# Patient Record
Sex: Female | Born: 1975 | Hispanic: Refuse to answer | Smoking: Never smoker
Health system: Southern US, Community
[De-identification: ages and names within clinical notes are randomized; demographics above are authoritative.]

## PROBLEM LIST (undated history)

## (undated) DIAGNOSIS — E669 Obesity, unspecified: Secondary | ICD-10-CM

---

## 2001-04-16 ENCOUNTER — Emergency Department (HOSPITAL_COMMUNITY): Admission: EM | Admit: 2001-04-16 | Discharge: 2001-04-16 | Payer: Self-pay | Admitting: Emergency Medicine

## 2001-07-02 ENCOUNTER — Emergency Department (HOSPITAL_COMMUNITY): Admission: EM | Admit: 2001-07-02 | Discharge: 2001-07-02 | Payer: Self-pay | Admitting: *Deleted

## 2001-10-24 ENCOUNTER — Emergency Department (HOSPITAL_COMMUNITY): Admission: EM | Admit: 2001-10-24 | Discharge: 2001-10-24 | Payer: Self-pay | Admitting: Emergency Medicine

## 2002-03-09 ENCOUNTER — Emergency Department (HOSPITAL_COMMUNITY): Admission: EM | Admit: 2002-03-09 | Discharge: 2002-03-09 | Payer: Self-pay | Admitting: Emergency Medicine

## 2002-03-10 ENCOUNTER — Encounter: Payer: Self-pay | Admitting: General Surgery

## 2002-03-10 ENCOUNTER — Ambulatory Visit (HOSPITAL_COMMUNITY): Admission: RE | Admit: 2002-03-10 | Discharge: 2002-03-10 | Payer: Self-pay | Admitting: General Surgery

## 2002-05-17 ENCOUNTER — Emergency Department (HOSPITAL_COMMUNITY): Admission: EM | Admit: 2002-05-17 | Discharge: 2002-05-17 | Payer: Self-pay | Admitting: *Deleted

## 2002-05-17 ENCOUNTER — Encounter: Payer: Self-pay | Admitting: *Deleted

## 2002-08-18 ENCOUNTER — Emergency Department (HOSPITAL_COMMUNITY): Admission: EM | Admit: 2002-08-18 | Discharge: 2002-08-18 | Payer: Self-pay | Admitting: *Deleted

## 2003-04-23 ENCOUNTER — Encounter: Payer: Self-pay | Admitting: *Deleted

## 2003-04-23 ENCOUNTER — Emergency Department (HOSPITAL_COMMUNITY): Admission: EM | Admit: 2003-04-23 | Discharge: 2003-04-23 | Payer: Self-pay | Admitting: *Deleted

## 2003-04-25 ENCOUNTER — Encounter: Payer: Self-pay | Admitting: *Deleted

## 2003-04-25 ENCOUNTER — Ambulatory Visit (HOSPITAL_COMMUNITY): Admission: RE | Admit: 2003-04-25 | Discharge: 2003-04-25 | Payer: Self-pay | Admitting: *Deleted

## 2005-03-21 ENCOUNTER — Emergency Department (HOSPITAL_COMMUNITY): Admission: EM | Admit: 2005-03-21 | Discharge: 2005-03-21 | Payer: Self-pay | Admitting: Emergency Medicine

## 2005-08-23 ENCOUNTER — Emergency Department (HOSPITAL_COMMUNITY): Admission: EM | Admit: 2005-08-23 | Discharge: 2005-08-23 | Payer: Self-pay | Admitting: Emergency Medicine

## 2006-06-11 ENCOUNTER — Emergency Department (HOSPITAL_COMMUNITY): Admission: EM | Admit: 2006-06-11 | Discharge: 2006-06-11 | Payer: Self-pay | Admitting: Emergency Medicine

## 2006-10-12 ENCOUNTER — Emergency Department (HOSPITAL_COMMUNITY): Admission: EM | Admit: 2006-10-12 | Discharge: 2006-10-12 | Payer: Self-pay | Admitting: Emergency Medicine

## 2009-05-01 ENCOUNTER — Emergency Department (HOSPITAL_COMMUNITY): Admission: EM | Admit: 2009-05-01 | Discharge: 2009-05-01 | Payer: Self-pay | Admitting: Emergency Medicine

## 2009-09-04 ENCOUNTER — Emergency Department: Payer: Self-pay | Admitting: Emergency Medicine

## 2009-11-08 ENCOUNTER — Emergency Department: Payer: Self-pay | Admitting: Emergency Medicine

## 2010-03-25 ENCOUNTER — Emergency Department: Payer: Self-pay | Admitting: Emergency Medicine

## 2010-04-13 ENCOUNTER — Emergency Department: Payer: Self-pay | Admitting: Emergency Medicine

## 2010-05-31 ENCOUNTER — Encounter: Payer: Self-pay | Admitting: Maternal & Fetal Medicine

## 2010-06-17 ENCOUNTER — Emergency Department: Payer: Self-pay | Admitting: Internal Medicine

## 2010-07-16 ENCOUNTER — Encounter: Payer: Self-pay | Admitting: Maternal & Fetal Medicine

## 2010-08-20 ENCOUNTER — Emergency Department: Payer: Self-pay | Admitting: Emergency Medicine

## 2010-09-09 ENCOUNTER — Emergency Department: Payer: Self-pay | Admitting: Emergency Medicine

## 2010-09-15 ENCOUNTER — Emergency Department: Payer: Self-pay | Admitting: Emergency Medicine

## 2010-10-05 ENCOUNTER — Ambulatory Visit: Payer: Self-pay | Admitting: Physician Assistant

## 2010-11-27 ENCOUNTER — Observation Stay: Payer: Self-pay

## 2010-12-08 ENCOUNTER — Inpatient Hospital Stay: Payer: Self-pay | Admitting: Obstetrics and Gynecology

## 2010-12-29 LAB — URINALYSIS, ROUTINE W REFLEX MICROSCOPIC
Bilirubin Urine: NEGATIVE
Glucose, UA: NEGATIVE mg/dL
Leukocytes, UA: NEGATIVE
Nitrite: NEGATIVE
Specific Gravity, Urine: 1.02 (ref 1.005–1.030)
Urobilinogen, UA: 2 mg/dL — ABNORMAL HIGH (ref 0.0–1.0)
pH: 7 (ref 5.0–8.0)

## 2010-12-29 LAB — DIFFERENTIAL
Eosinophils Relative: 1 % (ref 0–5)
Lymphocytes Relative: 28 % (ref 12–46)
Lymphs Abs: 2 10*3/uL (ref 0.7–4.0)
Neutro Abs: 4.6 10*3/uL (ref 1.7–7.7)

## 2010-12-29 LAB — CBC
HCT: 30.7 % — ABNORMAL LOW (ref 36.0–46.0)
Hemoglobin: 10.6 g/dL — ABNORMAL LOW (ref 12.0–15.0)
Platelets: 331 10*3/uL (ref 150–400)
WBC: 7.1 10*3/uL (ref 4.0–10.5)

## 2010-12-29 LAB — URINE MICROSCOPIC-ADD ON

## 2010-12-29 LAB — BASIC METABOLIC PANEL
BUN: 7 mg/dL (ref 6–23)
GFR calc non Af Amer: >60 mL/min (ref >60)
Potassium: 3 mEq/L — ABNORMAL LOW (ref 3.5–5.1)
Sodium: 135 mEq/L (ref 135–145)

## 2010-12-29 LAB — HCG, QUANTITATIVE, PREGNANCY: hCG, Beta Chain, Quant, S: 37276 m[IU]/mL — ABNORMAL HIGH (ref <5)

## 2012-12-05 ENCOUNTER — Emergency Department (HOSPITAL_COMMUNITY): Payer: No Typology Code available for payment source

## 2012-12-05 ENCOUNTER — Emergency Department (HOSPITAL_COMMUNITY)
Admission: EM | Admit: 2012-12-05 | Discharge: 2012-12-05 | Disposition: A | Discharge status: Home / Self Care | Payer: No Typology Code available for payment source | Attending: Emergency Medicine | Admitting: Emergency Medicine

## 2012-12-05 ENCOUNTER — Encounter (HOSPITAL_COMMUNITY): Payer: Self-pay | Admitting: Emergency Medicine

## 2012-12-05 DIAGNOSIS — S59909A Unspecified injury of unspecified elbow, initial encounter: Secondary | ICD-10-CM | POA: Insufficient documentation

## 2012-12-05 DIAGNOSIS — S139XXA Sprain of joints and ligaments of unspecified parts of neck, initial encounter: Secondary | ICD-10-CM | POA: Insufficient documentation

## 2012-12-05 DIAGNOSIS — S161XXA Strain of muscle, fascia and tendon at neck level, initial encounter: Secondary | ICD-10-CM

## 2012-12-05 DIAGNOSIS — S6990XA Unspecified injury of unspecified wrist, hand and finger(s), initial encounter: Secondary | ICD-10-CM | POA: Insufficient documentation

## 2012-12-05 DIAGNOSIS — Y92410 Unspecified street and highway as the place of occurrence of the external cause: Secondary | ICD-10-CM | POA: Insufficient documentation

## 2012-12-05 DIAGNOSIS — Y9389 Activity, other specified: Secondary | ICD-10-CM | POA: Insufficient documentation

## 2012-12-05 DIAGNOSIS — IMO0002 Reserved for concepts with insufficient information to code with codable children: Secondary | ICD-10-CM | POA: Insufficient documentation

## 2012-12-05 MED ORDER — METHOCARBAMOL 750 MG PO TABS: 750.0000 mg | ORAL_TABLET | Freq: Four times a day (QID) | ORAL | Status: DC | PRN

## 2012-12-05 MED ORDER — HYDROCODONE-ACETAMINOPHEN 5-325 MG PO TABS
2.0000 | ORAL_TABLET | Freq: Once | ORAL | Status: AC
  Administered 2012-12-05: 2 via ORAL
  Filled 2012-12-05: qty 2

## 2012-12-05 MED ORDER — HYDROCODONE-ACETAMINOPHEN 5-325 MG PO TABS: 1.0000 | ORAL_TABLET | Freq: Four times a day (QID) | ORAL | Status: DC | PRN

## 2012-12-05 MED ORDER — NAPROXEN 500 MG PO TABS: 500.0000 mg | ORAL_TABLET | Freq: Two times a day (BID) | ORAL | Status: DC | PRN

## 2012-12-05 MED ORDER — DIAZEPAM 5 MG PO TABS
10.0000 mg | ORAL_TABLET | Freq: Once | ORAL | Status: AC
  Administered 2012-12-05: 10 mg via ORAL
  Filled 2012-12-05: qty 2

## 2012-12-05 NOTE — ED Provider Notes (Signed)
Medical screening examination/treatment/procedure(s) were performed by non-physician practitioner and as supervising physician I was immediately available for consultation/collaboration.    Vida Roller, MD 12/05/12 [phone]

## 2012-12-05 NOTE — Discharge Instructions (Signed)
1. Medications: robaxin, naproxyn, vicodin, usual home medications 2. Treatment: rest, drink plenty of fluids, gentle stretching as discussed, alternate ice and heat 3. Follow Up: Please followup with your primary doctor for discussion of your diagnoses and further evaluation after today's visit; if you do not have a primary care doctor use the resource guide provided to find one;   Back Exercises Back exercises help treat and prevent back injuries. The goal is to increase your strength in your belly (abdominal) and back muscles. These exercises can also help with flexibility. Start these exercises when told by your doctor. HOME CARE Back exercises include: Pelvic Tilt.  Lie on your back with your knees bent. Tilt your pelvis until the lower part of your back is against the floor. Hold this position 5 to 10 sec. Repeat this exercise 5 to 10 times. Knee to Chest.  Pull 1 knee up against your chest and hold for 20 to 30 seconds. Repeat this with the other knee. This may be done with the other leg straight or bent, whichever feels better. Then, pull both knees up against your chest. Sit-Ups or Curl-Ups.  Bend your knees 90 degrees. Start with tilting your pelvis, and do a partial, slow sit-up. Only lift your upper half 30 to 45 degrees off the floor. Take at least 2 to 3 seonds for each sit-up. Do not do sit-ups with your knees out straight. If partial sit-ups are difficult, simply do the above but with only tightening your belly (abdominal) muscles and holding it as told. Hip-Lift.  Lie on your back with your knees flexed 90 degrees. Push down with your feet and shoulders as you raise your hips 2 inches off the floor. Hold for 10 seconds, repeat 5 to 10 times. Back Arches.  Lie on your stomach. Prop yourself up on bent elbows. Slowly press on your hands, causing an arch in your low back. Repeat 3 to 5 times. Shoulder-Lifts.  Lie face down with arms beside your body. Keep hips and belly  pressed to floor as you slowly lift your head and shoulders off the floor. Do not overdo your exercises. Be careful in the beginning. Exercises may cause you some mild back discomfort. If the pain lasts for more than 15 minutes, stop the exercises until you see your doctor. Improvement with exercise for back problems is slow.  Document Released: 10/12/2010 Document Revised: 12/02/2011 Document Reviewed: 07/11/2011 [facility] Patient Information 2013 [place], [place].    Cervical Strain Care After A cervical strain is when the muscles and ligaments in your neck have been stretched. The bones are not broken. If you had any problems moving your arms or legs immediately after the injury, even if the problem has gone away, make sure to tell this to your caregiver.  HOME CARE INSTRUCTIONS   While awake, apply ice packs to the neck or areas of pain about every 1 to 2 hours, for 15 to 20 minutes at a time. Do this for 2 days. If you were given a cervical collar for support, ask your caregiver if you may remove it for bathing or applying ice.   If given a cervical collar, wear as instructed. Do not remove any collar unless instructed by a caregiver.   Only take over-the-counter or prescription medicines for pain, discomfort, or fever as directed by your caregiver.  Recheck with the hospital or clinic after a radiologist has read your X-rays. Recheck with the hospital or clinic to make sure the initial readings are  correct. Do this also to determine if you need further studies. It is your responsibility to find out your X-ray results. X-rays are sometimes repeated in one week to ten days. These are often repeated to make sure that a hairline fracture was not overlooked. Ask your caregiver how you are to find out about your radiology (X-ray) results. SEEK IMMEDIATE MEDICAL CARE IF:   You have increasing pain in your neck.   You develop difficulties swallowing or breathing.   You have numbness, weakness, or  movement problems in the arms or legs.   You have difficulty walking.   You develop bowel or bladder retention or incontinence.   You have problems with walking.  MAKE SURE YOU:   Understand these instructions.   Will watch your condition.   Will get help right away if you are not doing well or get worse.  Document Released: 09/09/2005 Document Revised: 05/22/2011 Document Reviewed: 04/22/2008 [facility] Patient Information 2012 [place], [place].        RESOURCE GUIDE  Chronic Pain Problems: Contact [facility]  [phone] Patients need to be referred by their primary care doctor.  Insufficient Money for Medicine: Contact United Way:  call "211."   No Primary Care Doctor: - Call Health Connect  [phone] - can help you locate a primary care doctor that  accepts your insurance, provides certain services, etc. - Physician Referral Service- [phone]  Agencies that provide inexpensive medical care: - [facility]  [phone] - [facility]  [phone] - [facility]  [phone] - Women's Clinic  [phone] - Planned Parenthood  [phone] [facility]  [phone]  Medicaid-accepting [facility] Providers: - [facility]- [address]  [phone], Mon-Fri 9am-7pm, Sat 9am-1pm - [facility]- [address] [place], [address] [place]  [phone] - [facility]- [address], [address] [place]  [phone] [facility]- [address]  [phone] - Renaye Rakers- [address] [place], [address], [phone]  Only accepts [place] patients after they have their name  applied to their card  Self Pay (no insurance) in [place]: - Sickle Cell Patients: Dr Willey Blade, [facility] Internal Medicine  [address] [place] [address] [place], [phone] - [facility]- [address] [place]  [phone]       [facility]  [place]- [address]       -     [facility]- see information above (Speak to [employer] if you do not have insurance)       -  [facility] [place]- [address],  [phone]       -  [facility]- [address] [phone]       -  Dr Julio Sicks-  [address], [place], [phone]       -  [facility] - [address], [phone]       -  [facility]- [address] [phone], also [address], [phone]       -    [facility]- [address] [place], [phone], 1st & 3rd Saturday        every month, 10am-1pm  [facility] [address] [place], [place] [address] [phone]  [facility] [address] [place], [place] [address] [phone]  1) Find a Doctor and Pay Out of Pocket Although you won't have to find out who is covered by your insurance plan, it is a good idea to ask around and get recommendations. You will then need to call the office and see if the doctor you have chosen will accept you as a new patient and what types of options they offer for patients who are self-pay. Some doctors offer discounts or will set up payment plans for their patients who do not have insurance, but you will need to ask so you aren't surprised when you get to your appointment.  2) Contact Your Local Health Department Not all health departments have doctors that can see patients for sick visits, but many do, so it is worth a call to see if yours does. If you don't know where your local health department is, you can check in your phone book. The CDC also has a tool to help you locate your state's health department, and many state websites also have listings of all of their local health departments.  3) Find a Walk-in Clinic If your illness is not likely to be very severe or complicated, you may want to try a walk in clinic. These are popping up all  over the country in pharmacies, drugstores, and shopping centers. They're usually staffed by nurse practitioners or physician assistants that have been trained to treat common illnesses and complaints. They're usually fairly quick and inexpensive. However, if you have serious medical issues or chronic medical problems, these are probably not your best option  STD Testing - [facility] Department of [facility] [place], STD Clinic, [address] [place], phone [phone] or [phone].  Monday - Friday, call for an appointment. [facility] Department of [employer], STD Clinic, [address], [place], phone [phone] or [phone].  Monday - Friday, call for an appointment.  Abuse/Neglect: [facility] Child Abuse Hotline [phone] [facility] Child Abuse Hotline 838-246-9564 (After Hours)  Emergency Shelter:  [facility] [phone]  Maternity Homes: - [facility] at [facility] [phone] - [facility] [phone]  MRSA Hotline #:   (616)624-8645  Dental Assistance  If unable to pay or uninsured, contact:  [facility]. to become qualified for the adult dental clinic.  Patients with Medicaid: [facility] [phone] [address], [phone] [address] [phone]  If unable to pay, or uninsured, contact [facility] [phone] in [place], [phone] in [facility]) to become qualified for the adult dental clinic  [facility] [address] [place], [place] [address] [phone] [email or web address]  Other Proofreader Services: - [facility]- [address] [place], [place], [place], [address], [phone], Ext. [phone], 2nd and 4th Thursday of the month at 6:30am.  10 clients each day by appointment, can sometimes see walk-in patients if someone does not show for an appointment. [facility]- [address] [place], [place], [address], [phone] - [facility] [address] [place], [place], [address], [phone] - [facility]- [phone] [facility] Health Department- [phone] [facility] Health [phone]       Behavioral Health Resources in the [facility]  Intensive Outpatient Programs: [facility]      [address] [place],   [phone] Both a day and evening program       [facility] Outpatient     [address]        [place], [place] [address] [phone]         ADS: [facility] [address] [place] [phone]  [facility] Mental Health ACCESS LINE: [phone] or [phone] [address] [place], [place] [address] [email or web address]  Behavioral Health Services  Substance Abuse Resources: - [facility]  [phone] - [facility] [phone] - [facility] [phone] Floydene Flock [phone] - Residential & Outpatient Substance Abuse Program  [phone]  Psychological Services: [facility]  [phone] [facility] Services  [phone] - [facility], [phone] [place]. [address] [place], ACCESS LINE: [phone] or [phone], [email or web address]  Mobile Crisis Teams:                                        Therapeutic Alternatives         Mobile Crisis Care Unit [phone]             [facility] [address] Ginette Otto [phone]                                         Interventionist [address] [place] [phone]  Self-Help/Support Groups: Mental Health Assoc. of [employer] of support groups 6801704398 (call for more info)   Narcotics Anonymous (NA) Caring Services [address] [place] - 2 meetings at this location  Residential Treatment Programs:  [facility]      [address]        [place]       [phone]         [facility] [address], [place] [address] [place], [place]  [address] [phone]  [facility]  [address] [place], [place] [address] [phone] Admissions: 8am-3pm M-F  [facility]     [address]        [place], [place] [address]       [phone]         [facility] [address] [place], [place] [phone]  [facility] [address] [place], [place] [phone]  [facility] - Intensive Outpatient      [address]     [place], [place]       [phone]         [facility] ([facility]     [address] [place], [place] [phone] or [phone]  Residential Treatment Services (RTS), Medicaid [address] [place], [place] [phone]  [facility] [address]                                               [address] [place] [phone]  [facility] Resources: Estate manager/land agent [phone]               General Therapy  Angie Fava, PhD        [address] [place], [place] [address]         [phone]   Insurance  [facility] Behavioral   [address] [place], [place] [address] [phone]  [facility] [address] [place], [place] [address] [phone] Insurance/Medicaid/sponsorship through [facility] and Families                                              [address]                                        [place], [place] [address]    Therapy/tele-psych/case         [phone]          [facility] [address], [place]  [address]  Adolescent/group home/case  management [phone]                                           Creola Corn PhD       General therapy       Insurance   [phone]         Dr. Lolly Mustache, Insurance, M-F [phone]  [facility] [place]  United [employer] [facility] Dept. [address]                 [address]         [address] [place] [address]  Blondell Reveal Phone:  [phone]                                  Phone:  [phone]                   Phone:  [phone]  [facility] Mental Health, [phone] - [facility]- [phone]       -     [facility] in [place], [address],             [phone], Insurance  [place] Child Abuse Hotline [phone]) [phone] or [phone] (After Hours)
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
## 2013-02-28 ENCOUNTER — Emergency Department (HOSPITAL_COMMUNITY)
Admission: EM | Admit: 2013-02-28 | Discharge: 2013-02-28 | Disposition: A | Discharge status: Home / Self Care | Payer: Self-pay | Attending: Emergency Medicine | Admitting: Emergency Medicine

## 2013-02-28 ENCOUNTER — Encounter (HOSPITAL_COMMUNITY): Payer: Self-pay

## 2013-02-28 DIAGNOSIS — R0789 Other chest pain: Secondary | ICD-10-CM

## 2013-02-28 DIAGNOSIS — M79609 Pain in unspecified limb: Secondary | ICD-10-CM | POA: Insufficient documentation

## 2013-02-28 DIAGNOSIS — R52 Pain, unspecified: Secondary | ICD-10-CM | POA: Insufficient documentation

## 2013-02-28 DIAGNOSIS — Z3202 Encounter for pregnancy test, result negative: Secondary | ICD-10-CM | POA: Insufficient documentation

## 2013-02-28 DIAGNOSIS — R071 Chest pain on breathing: Secondary | ICD-10-CM | POA: Insufficient documentation

## 2013-02-28 LAB — URINALYSIS, ROUTINE W REFLEX MICROSCOPIC
Bilirubin Urine: NEGATIVE
Leukocytes, UA: NEGATIVE
Nitrite: NEGATIVE
Specific Gravity, Urine: 1.023 (ref 1.005–1.030)
pH: 6 (ref 5.0–8.0)

## 2013-02-28 LAB — URINE MICROSCOPIC-ADD ON

## 2013-02-28 LAB — POCT PREGNANCY, URINE: Preg Test, Ur: NEGATIVE

## 2013-02-28 MED ORDER — DIAZEPAM 5 MG PO TABS
5.0000 mg | ORAL_TABLET | Freq: Once | ORAL | Status: AC
  Administered 2013-02-28: 5 mg via ORAL
  Filled 2013-02-28: qty 1

## 2013-02-28 MED ORDER — IBUPROFEN 800 MG PO TABS: 800.0000 mg | ORAL_TABLET | Freq: Three times a day (TID) | ORAL | Status: AC

## 2013-02-28 MED ORDER — HYDROCODONE-ACETAMINOPHEN 5-325 MG PO TABS: 1.0000 | ORAL_TABLET | Freq: Three times a day (TID) | ORAL | Status: DC | PRN

## 2013-02-28 MED ORDER — IBUPROFEN 800 MG PO TABS
800.0000 mg | ORAL_TABLET | Freq: Once | ORAL | Status: AC
  Administered 2013-02-28: 800 mg via ORAL
  Filled 2013-02-28: qty 1

## 2013-02-28 MED ORDER — DIAZEPAM 5 MG PO TABS: 5.0000 mg | ORAL_TABLET | Freq: Two times a day (BID) | ORAL | Status: AC

## 2013-02-28 MED ORDER — HYDROCODONE-ACETAMINOPHEN 5-325 MG PO TABS
1.0000 | ORAL_TABLET | Freq: Once | ORAL | Status: AC
  Administered 2013-02-28: 1 via ORAL
  Filled 2013-02-28: qty 1

## 2013-02-28 NOTE — ED Notes (Addendum)
Patient asked to give urine specimen but  Can't void at present moment. Nurse aware

## 2013-02-28 NOTE — ED Notes (Signed)
MD at bedside.  EDP [place] in to see this pt and evaluate,

## 2013-02-28 NOTE — ED Provider Notes (Signed)
History     CSN: [id number]  Arrival date & time 02/28/13  1657   First MD Initiated Contact with Patient 02/28/13 1734      Chief Complaint  Patient presents with  . Flank Pain    (Consider location/radiation/quality/duration/timing/severity/associated sxs/prior treatment) HPI Patient presents after the acute onset of pain in her left inferior axilla.  Pain began just prior to arrival when the patient was reaching for a heavy object at work.  Since onset the pain has been severe, sharp, and the left inferior axilla with radiation towards the inferior breast.  No other abdominal pain, no chest pain, no dyspnea no lightheadedness, no CP, no nausea, no vomiting. No tabs at relief with anything. She states that she was in her usual state of health until this event. History reviewed. No pertinent past medical history.  History reviewed. No pertinent past surgical history.  History reviewed. No pertinent family history.  History  Substance Use Topics  . Smoking status: Never Smoker   . Smokeless tobacco: Not on file  . Alcohol Use: No    OB History   Grav Para Term Preterm Abortions TAB SAB Ect Mult Living                  Review of Systems  All other systems reviewed and are negative.    Allergies  Review of patient's allergies indicates no known allergies.  Home Medications   Current Outpatient Rx  Name  Route  Sig  Dispense  Refill  . diazepam (VALIUM) 5 MG tablet   Oral   Take 1 tablet (5 mg total) by mouth 2 (two) times daily.   6 tablet   0   . HYDROcodone-acetaminophen (NORCO/VICODIN) 5-325 MG per tablet   Oral   Take 1 tablet by mouth every 8 (eight) hours as needed for pain.   15 tablet   0   . ibuprofen (ADVIL,MOTRIN) 800 MG tablet   Oral   Take 1 tablet (800 mg total) by mouth 3 (three) times daily.   12 tablet   0     BP 121/73  Pulse 71  Temp(Src) 98.3 F (36.8 C) (Oral)  Resp 15  SpO2 100%  LMP 02/25/2013  Physical Exam  Nursing  note and vitals reviewed. Constitutional: She is oriented to person, place, and time. She appears well-developed and well-nourished. No distress.  HENT:  Head: Normocephalic and atraumatic.  Eyes: Conjunctivae and EOM are normal.  Cardiovascular: Normal rate and regular rhythm.   Pulmonary/Chest: Effort normal. No accessory muscle usage or stridor. No apnea and not tachypneic. No respiratory distress. She has no decreased breath sounds. She has no wheezes. She has no rhonchi. She has no rales.  No appreciable deformity. The patient states that the pain feels deeper than the area palpated about the left inferior chest.  1  Abdominal: She exhibits no distension.  Musculoskeletal: She exhibits no edema.  Neurological: She is alert and oriented to person, place, and time. No cranial nerve deficit.  Skin: Skin is warm and dry.  Psychiatric: She has a normal mood and affect.    ED Course  Procedures (including critical care time)  Labs Reviewed  URINALYSIS, ROUTINE W REFLEX MICROSCOPIC - Abnormal; Notable for the following:    Hgb urine dipstick TRACE (*)    All other components within normal limits  URINE MICROSCOPIC-ADD ON  POCT PREGNANCY, URINE   No results found.   1. Acute chest wall pain    On  repeat exam, after discussion of results, the patient references that she just finished her menstrual cycle. She has no history of kidney stones. She has marginal improvement in her pain.    MDM  The patient presents with acute onset of left axillary pain while reaching for a heavy object.  The absence of hypoxia, tachypnea his reassuring for the low suspicion of pneumothorax or spontaneous fracture of a rib. The absence of any urinary complaints, hematuria grossly, history of nephrolithiasis is reassuring for the low suspicion of nephrolithiasis. The patient is hemodynamically stable. After return of results, and reassessment I discussed discharged home with a trial of analgesics  versus a CT scan or other imaging for additional evaluation now.  The patient voiced her preference for an attempted and analgesics at home, and stated an understanding of return precautions, follow up instructions. The patient was with her husband, who concurs.        Gerhard Munch, MD 02/28/13 2000

## 2013-02-28 NOTE — ED Notes (Signed)
She c/o sudden onset of left flank pain while pushing a cart at work, which persists.  She grimaces as if in much pain.  She ambulates slowly and capably.

## 2014-05-26 ENCOUNTER — Emergency Department (HOSPITAL_COMMUNITY)
Admission: EM | Admit: 2014-05-26 | Discharge: 2014-05-26 | Disposition: A | Discharge status: Home / Self Care | Payer: No Typology Code available for payment source | Attending: Emergency Medicine | Admitting: Emergency Medicine

## 2014-05-26 ENCOUNTER — Emergency Department (HOSPITAL_COMMUNITY): Payer: No Typology Code available for payment source

## 2014-05-26 ENCOUNTER — Encounter (HOSPITAL_COMMUNITY): Payer: Self-pay | Admitting: Emergency Medicine

## 2014-05-26 DIAGNOSIS — S298XXA Other specified injuries of thorax, initial encounter: Secondary | ICD-10-CM | POA: Diagnosis not present

## 2014-05-26 DIAGNOSIS — R1084 Generalized abdominal pain: Secondary | ICD-10-CM

## 2014-05-26 DIAGNOSIS — Z3202 Encounter for pregnancy test, result negative: Secondary | ICD-10-CM | POA: Diagnosis not present

## 2014-05-26 DIAGNOSIS — R0789 Other chest pain: Secondary | ICD-10-CM

## 2014-05-26 DIAGNOSIS — S3981XA Other specified injuries of abdomen, initial encounter: Secondary | ICD-10-CM | POA: Diagnosis not present

## 2014-05-26 DIAGNOSIS — Y92410 Unspecified street and highway as the place of occurrence of the external cause: Secondary | ICD-10-CM | POA: Diagnosis not present

## 2014-05-26 DIAGNOSIS — Y9389 Activity, other specified: Secondary | ICD-10-CM | POA: Diagnosis not present

## 2014-05-26 LAB — POC URINE PREG, ED: PREG TEST UR: NEGATIVE

## 2014-05-26 LAB — COMPREHENSIVE METABOLIC PANEL
ALT: 10 U/L (ref 0–35)
AST: 14 U/L (ref 0–37)
Albumin: 3.3 g/dL — ABNORMAL LOW (ref 3.5–5.2)
Alkaline Phosphatase: 86 U/L (ref 39–117)
Anion gap: 13 (ref 5–15)
BUN: 15 mg/dL (ref 6–23)
CALCIUM: 9 mg/dL (ref 8.4–10.5)
CO2: 22 meq/L (ref 19–32)
CREATININE: 0.71 mg/dL (ref 0.50–1.10)
Chloride: 102 mEq/L (ref 96–112)
GLUCOSE: 90 mg/dL (ref 70–99)
Potassium: 3.7 mEq/L (ref 3.7–5.3)
SODIUM: 137 meq/L (ref 137–147)
Total Bilirubin: <0.2 mg/dL — ABNORMAL LOW (ref 0.3–1.2)
Total Protein: 7.7 g/dL (ref 6.0–8.3)

## 2014-05-26 LAB — CBC WITH DIFFERENTIAL/PLATELET
Basophils Absolute: 0 10*3/uL (ref 0.0–0.1)
Basophils Relative: 0 % (ref 0–1)
EOS PCT: 1 % (ref 0–5)
Eosinophils Absolute: 0.1 10*3/uL (ref 0.0–0.7)
HEMATOCRIT: 29.5 % — AB (ref 36.0–46.0)
Hemoglobin: 9.1 g/dL — ABNORMAL LOW (ref 12.0–15.0)
LYMPHS ABS: 2.2 10*3/uL (ref 0.7–4.0)
LYMPHS PCT: 19 % (ref 12–46)
MCH: 23.2 pg — ABNORMAL LOW (ref 26.0–34.0)
MCHC: 30.8 g/dL (ref 30.0–36.0)
MCV: 75.3 fL — AB (ref 78.0–100.0)
MONO ABS: 0.9 10*3/uL (ref 0.1–1.0)
MONOS PCT: 8 % (ref 3–12)
Neutro Abs: 8.3 10*3/uL — ABNORMAL HIGH (ref 1.7–7.7)
Neutrophils Relative %: 72 % (ref 43–77)
Platelets: 444 10*3/uL — ABNORMAL HIGH (ref 150–400)
RBC: 3.92 MIL/uL (ref 3.87–5.11)
RDW: 16.9 % — ABNORMAL HIGH (ref 11.5–15.5)
WBC: 11.5 10*3/uL — AB (ref 4.0–10.5)

## 2014-05-26 LAB — URINALYSIS, ROUTINE W REFLEX MICROSCOPIC
Bilirubin Urine: NEGATIVE
Glucose, UA: NEGATIVE mg/dL
Ketones, ur: NEGATIVE mg/dL
Leukocytes, UA: NEGATIVE
Nitrite: NEGATIVE
PROTEIN: 30 mg/dL — AB
Specific Gravity, Urine: 1.023 (ref 1.005–1.030)
UROBILINOGEN UA: 1 mg/dL (ref 0.0–1.0)
pH: 6 (ref 5.0–8.0)

## 2014-05-26 LAB — URINE MICROSCOPIC-ADD ON

## 2014-05-26 MED ORDER — HYDROCODONE-ACETAMINOPHEN 5-325 MG PO TABS
2.0000 | ORAL_TABLET | Freq: Once | ORAL | Status: AC
  Administered 2014-05-26: 2 via ORAL
  Filled 2014-05-26: qty 2

## 2014-05-26 MED ORDER — HYDROCODONE-ACETAMINOPHEN 5-325 MG PO TABS: 1.0000 | ORAL_TABLET | ORAL | Status: DC | PRN

## 2014-05-26 MED ORDER — FENTANYL CITRATE 0.05 MG/ML IJ SOLN
50.0000 ug | INTRAMUSCULAR | Status: DC | PRN
  Filled 2014-05-26: qty 2

## 2014-05-26 NOTE — ED Notes (Signed)
Pt refused IV  

## 2014-05-26 NOTE — ED Notes (Signed)
Dr.Zavitz aware of pt refusing IV

## 2014-05-26 NOTE — Discharge Instructions (Signed)
Return if you change your  Mind and would like a CT scan or if your symptoms worsen.  If you were given medicines take as directed.  If you are on coumadin or contraceptives realize their levels and effectiveness is altered by many different medicines.  If you have any reaction (rash, tongues swelling, other) to the medicines stop taking and see a physician.   Please follow up as directed and return to the ER or see a physician for new or worsening symptoms.  Thank you. Filed Vitals:   05/26/14 1807 05/26/14 2039  BP: 138/86 118/60  Pulse: 100 75  Temp: 99 F (37.2 C)   TempSrc: Oral   Resp: 16 16  Weight: 240 lb (108.863 kg)   SpO2: 98% 99%
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
## 2014-05-29 ENCOUNTER — Emergency Department (HOSPITAL_COMMUNITY)
Admission: EM | Admit: 2014-05-29 | Discharge: 2014-05-30 | Disposition: A | Discharge status: Home / Self Care | Payer: No Typology Code available for payment source | Attending: Emergency Medicine | Admitting: Emergency Medicine

## 2014-05-29 ENCOUNTER — Emergency Department (HOSPITAL_COMMUNITY): Payer: No Typology Code available for payment source

## 2014-05-29 ENCOUNTER — Encounter (HOSPITAL_COMMUNITY): Payer: Self-pay | Admitting: Emergency Medicine

## 2014-05-29 DIAGNOSIS — S20219A Contusion of unspecified front wall of thorax, initial encounter: Secondary | ICD-10-CM | POA: Insufficient documentation

## 2014-05-29 DIAGNOSIS — E049 Nontoxic goiter, unspecified: Secondary | ICD-10-CM | POA: Diagnosis not present

## 2014-05-29 DIAGNOSIS — IMO0002 Reserved for concepts with insufficient information to code with codable children: Secondary | ICD-10-CM | POA: Insufficient documentation

## 2014-05-29 DIAGNOSIS — R319 Hematuria, unspecified: Secondary | ICD-10-CM | POA: Diagnosis not present

## 2014-05-29 DIAGNOSIS — K5900 Constipation, unspecified: Secondary | ICD-10-CM | POA: Insufficient documentation

## 2014-05-29 DIAGNOSIS — D508 Other iron deficiency anemias: Secondary | ICD-10-CM | POA: Insufficient documentation

## 2014-05-29 DIAGNOSIS — S301XXA Contusion of abdominal wall, initial encounter: Secondary | ICD-10-CM | POA: Insufficient documentation

## 2014-05-29 DIAGNOSIS — S3981XA Other specified injuries of abdomen, initial encounter: Secondary | ICD-10-CM | POA: Insufficient documentation

## 2014-05-29 DIAGNOSIS — Y92410 Unspecified street and highway as the place of occurrence of the external cause: Secondary | ICD-10-CM | POA: Insufficient documentation

## 2014-05-29 DIAGNOSIS — E669 Obesity, unspecified: Secondary | ICD-10-CM | POA: Diagnosis not present

## 2014-05-29 DIAGNOSIS — R011 Cardiac murmur, unspecified: Secondary | ICD-10-CM | POA: Insufficient documentation

## 2014-05-29 DIAGNOSIS — Y9389 Activity, other specified: Secondary | ICD-10-CM | POA: Diagnosis not present

## 2014-05-29 DIAGNOSIS — Z3202 Encounter for pregnancy test, result negative: Secondary | ICD-10-CM | POA: Diagnosis not present

## 2014-05-29 DIAGNOSIS — S301XXD Contusion of abdominal wall, subsequent encounter: Secondary | ICD-10-CM

## 2014-05-29 DIAGNOSIS — S20212D Contusion of left front wall of thorax, subsequent encounter: Secondary | ICD-10-CM

## 2014-05-29 DIAGNOSIS — M549 Dorsalgia, unspecified: Secondary | ICD-10-CM

## 2014-05-29 DIAGNOSIS — R3129 Other microscopic hematuria: Secondary | ICD-10-CM

## 2014-05-29 HISTORY — DX: Obesity, unspecified: E66.9

## 2014-05-29 LAB — URINALYSIS, ROUTINE W REFLEX MICROSCOPIC
Bilirubin Urine: NEGATIVE
Glucose, UA: NEGATIVE mg/dL
Ketones, ur: NEGATIVE mg/dL
Leukocytes, UA: NEGATIVE
Nitrite: NEGATIVE
Protein, ur: NEGATIVE mg/dL
Specific Gravity, Urine: 1.027 (ref 1.005–1.030)
Urobilinogen, UA: 1 mg/dL (ref 0.0–1.0)
pH: 6.5 (ref 5.0–8.0)

## 2014-05-29 LAB — CBC WITH DIFFERENTIAL/PLATELET
Basophils Absolute: 0 10*3/uL (ref 0.0–0.1)
Basophils Relative: 0 % (ref 0–1)
EOS ABS: 0.1 10*3/uL (ref 0.0–0.7)
Eosinophils Relative: 1 % (ref 0–5)
HCT: 27 % — ABNORMAL LOW (ref 36.0–46.0)
Hemoglobin: 8 g/dL — ABNORMAL LOW (ref 12.0–15.0)
LYMPHS ABS: 3.1 10*3/uL (ref 0.7–4.0)
Lymphocytes Relative: 32 % (ref 12–46)
MCH: 22.9 pg — AB (ref 26.0–34.0)
MCHC: 29.6 g/dL — ABNORMAL LOW (ref 30.0–36.0)
MCV: 77.4 fL — ABNORMAL LOW (ref 78.0–100.0)
MONOS PCT: 6 % (ref 3–12)
Monocytes Absolute: 0.6 10*3/uL (ref 0.1–1.0)
NEUTROS PCT: 61 % (ref 43–77)
Neutro Abs: 6 10*3/uL (ref 1.7–7.7)
Platelets: 438 10*3/uL — ABNORMAL HIGH (ref 150–400)
RBC: 3.49 MIL/uL — AB (ref 3.87–5.11)
RDW: 17 % — ABNORMAL HIGH (ref 11.5–15.5)
WBC: 9.8 10*3/uL (ref 4.0–10.5)

## 2014-05-29 LAB — COMPREHENSIVE METABOLIC PANEL WITH GFR
ALT: 10 U/L (ref 0–35)
AST: 11 U/L (ref 0–37)
Albumin: 3.1 g/dL — ABNORMAL LOW (ref 3.5–5.2)
Alkaline Phosphatase: 84 U/L (ref 39–117)
Anion gap: 12 (ref 5–15)
BUN: 11 mg/dL (ref 6–23)
CO2: 24 meq/L (ref 19–32)
Calcium: 9 mg/dL (ref 8.4–10.5)
Chloride: 106 meq/L (ref 96–112)
Creatinine, Ser: 0.63 mg/dL (ref 0.50–1.10)
GFR calc Af Amer: >90 mL/min
GFR calc non Af Amer: >90 mL/min
Glucose, Bld: 88 mg/dL (ref 70–99)
Potassium: 4.2 meq/L (ref 3.7–5.3)
Sodium: 142 meq/L (ref 137–147)
Total Bilirubin: <0.2 mg/dL — ABNORMAL LOW (ref 0.3–1.2)
Total Protein: 7.4 g/dL (ref 6.0–8.3)

## 2014-05-29 LAB — HEMOGLOBIN AND HEMATOCRIT, BLOOD
HCT: 25.8 % — ABNORMAL LOW (ref 36.0–46.0)
Hemoglobin: 7.7 g/dL — ABNORMAL LOW (ref 12.0–15.0)

## 2014-05-29 LAB — URINE MICROSCOPIC-ADD ON

## 2014-05-29 LAB — POC URINE PREG, ED: Preg Test, Ur: NEGATIVE

## 2014-05-29 MED ORDER — IBUPROFEN 800 MG PO TABS: 800.0000 mg | ORAL_TABLET | Freq: Three times a day (TID) | ORAL | Status: AC

## 2014-05-29 MED ORDER — IOHEXOL 300 MG/ML  SOLN: 25.0000 mL | Freq: Once | INTRAMUSCULAR | Status: DC | PRN

## 2014-05-29 MED ORDER — TRAMADOL HCL 50 MG PO TABS: 50.0000 mg | ORAL_TABLET | Freq: Four times a day (QID) | ORAL | Status: AC | PRN

## 2014-05-29 MED ORDER — OXYCODONE-ACETAMINOPHEN 5-325 MG PO TABS
1.0000 | ORAL_TABLET | Freq: Once | ORAL | Status: AC
  Administered 2014-05-29: 1 via ORAL
  Filled 2014-05-29: qty 1

## 2014-05-29 MED ORDER — ONDANSETRON HCL 4 MG/2ML IJ SOLN
4.0000 mg | Freq: Once | INTRAMUSCULAR | Status: AC
  Administered 2014-05-29: 4 mg via INTRAVENOUS
  Filled 2014-05-29: qty 2

## 2014-05-29 MED ORDER — SODIUM CHLORIDE 0.9 % IV BOLUS (SEPSIS)
1000.0000 mL | Freq: Once | INTRAVENOUS | Status: AC
  Administered 2014-05-29: 1000 mL via INTRAVENOUS

## 2014-05-29 MED ORDER — IOHEXOL 300 MG/ML  SOLN
100.0000 mL | Freq: Once | INTRAMUSCULAR | Status: AC | PRN
  Administered 2014-05-29: 100 mL via INTRAVENOUS

## 2014-05-29 MED ORDER — MORPHINE SULFATE 4 MG/ML IJ SOLN
4.0000 mg | Freq: Once | INTRAMUSCULAR | Status: AC
  Administered 2014-05-29: 4 mg via INTRAVENOUS
  Filled 2014-05-29: qty 1
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
## 2014-05-29 NOTE — ED Notes (Signed)
Multiple attempts to collect blood. Unsuccessful. Phelbotomy notified.
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
## 2014-05-30 MED ORDER — FERROUS SULFATE 325 (65 FE) MG PO TABS: 325.0000 mg | ORAL_TABLET | Freq: Three times a day (TID) | ORAL | Status: AC

## 2014-05-30 MED ORDER — DOCUSATE SODIUM 100 MG PO CAPS: 100.0000 mg | ORAL_CAPSULE | Freq: Two times a day (BID) | ORAL | Status: AC

## 2014-05-30 NOTE — Discharge Instructions (Signed)
Return to the emergency room and, urgent care, primary care provider in 4-7 days for a repeat CBC. Call for a follow up appointment with a Family or Primary Care Provider.  Return if Symptoms worsen.   Take medication as prescribed.  Ice your chest wall, back, and breast 3-4 times a day. Take iron pills as directed. Drink plenty of fluids, well balanced diet.   Emergency Department Resource Guide 1) Find a Doctor and Pay Out of Pocket Although you won't have to find out who is covered by your insurance plan, it is a good idea to ask around and get recommendations. You will then need to call the office and see if the doctor you have chosen will accept you as a new patient and what types of options they offer for patients who are self-pay. Some doctors offer discounts or will set up payment plans for their patients who do not have insurance, but you will need to ask so you aren't surprised when you get to your appointment.  2) Contact Your Local Health Department Not all health departments have doctors that can see patients for sick visits, but many do, so it is worth a call to see if yours does. If you don't know where your local health department is, you can check in your phone book. The CDC also has a tool to help you locate your state's health department, and many state websites also have listings of all of their local health departments.  3) Find a Walk-in Clinic If your illness is not likely to be very severe or complicated, you may want to try a walk in clinic. These are popping up all over the country in pharmacies, drugstores, and shopping centers. They're usually staffed by nurse practitioners or physician assistants that have been trained to treat common illnesses and complaints. They're usually fairly quick and inexpensive. However, if you have serious medical issues or chronic medical problems, these are probably not your best option.  No Primary Care Doctor: - Call Health Connect at   [phone] - they can help you locate a primary care doctor that  accepts your insurance, provides certain services, etc. - Physician Referral Service- [phone]  Chronic Pain Problems: Organization         Address  Phone   Notes  [facility]  [phone] Patients need to be referred by their primary care doctor.   Medication Assistance: Organization         Address  Phone   Notes  [facility] Medication [facility] [address] [place] [place], [place] [address] [phone] --Must be a resident of [facility] -- Must have NO insurance coverage whatsoever (no Medicaid/ Medicare, etc.) -- The pt. MUST have a primary care doctor that directs their care regularly and follows them in the community   [facility]  [phone]   [employer]  [phone]    Agencies that provide inexpensive medical care: Organization         Address  Phone   Notes  [facility]  [phone]   [facility]    [phone]   [facility] [address] [place], [place] [address] [phone]   [facility] [address] [place] [place] [phone]   Planned Parenthood    [phone]   [facility]    [phone]   [facility]  [address], [place] Phone:  [phone], Fax:  [phone] Hours of Operation:  9 am - 6 pm, M-F.  Also accepts Medicaid/Medicare and self-pay.  [facility] for [place], [address], [place] Phone: [phone], Fax: [phone]. Hours of Operation:  8:30 am - 5:30 pm, M-F.  Also accepts Medicaid and self-pay.  [facility] [place] [address] [place] Phone: [phone]   [place], [place], [place] [phone], Ext. [phone] Mondays & Thursdays: 7-9 AM.  First 15 patients are seen on a first come, first serve basis.     [place] Providers:  Organization         Address  Phone   Notes  [facility] [address], [place] [phone] Also accepts self-pay patients.  [facility] [address], [place]  [phone]   [place], [address], [place] [phone]   [facility] [address] [place] [phone]   Lucianne Lei [address], [place]   [phone] Only accepts [place] Access [place] patients after they have their name applied to their card.   Self-Pay (no insurance) in [facility]:  Organization         Address  Phone   Notes  Sickle Cell Patients, [facility] [place] [phone]   [facility] [place] [phone]   [facility] [place]  [place], [place], [place] [phone]   [facility]/Dr. Osei-Bonsu  [address], [place] or [address], [place] [phone] Phone number for both [place] and [place] locations is the same.  [facility] and [facility] [address], [place] [phone]   [facility] [address], [place] or [address] [phone]   [facility] [address] [place] [phone], phone; [phone], fax Sees patients 1st and 3rd Saturday of every month.  Must not qualify for public or private insurance (i.e. Medicaid, Medicare, [place] Health Choice, Veterans' Benefits)  Household income should be no more than 200% of the poverty level The clinic cannot treat you if you are pregnant or think you are pregnant  Sexually transmitted diseases are not treated at the clinic.    Dental Care: Organization         Address  Phone  Notes  [facility]  Department of [facility] [place] [phone] Accepts children up to age 85 who are enrolled in [place] or [place]; pregnant women with a Medicaid card; and children who have applied for Medicaid or [place] Health Choice, but were declined, whose parents can pay a reduced fee at time of service.  [facility] Department of [facility]  [address], [place] [phone] Accepts children up to age 70 who are enrolled in [place] or [place]; pregnant women with a Medicaid card; and children who have applied for Medicaid or [place] Health Choice, but were declined, whose parents can pay a reduced fee at time of service.  [facility] Adult Dental Access PROGRAM  [place] [phone] Patients are  seen by appointment only. Walk-ins are not accepted. [place] will see patients 9 years of age and older. Monday - Tuesday (8am-5pm) Most Wednesdays (8:30-5pm) $30 per visit, cash only  [facility] Adult Dental Access PROGRAM  [address], [facility] [phone] Patients are seen by appointment only. Walk-ins are not accepted. [place] will see patients 70 years of age and older. One Wednesday Evening (Monthly: Volunteer Based).  $30 per visit, cash only  [place]  [phone] for adults; Children under age 49, call Graduate Pediatric Dentistry at [phone]. Children aged 90-14, please call [phone] to request a pediatric application.  Dental services are provided in all areas of dental care including fillings, crowns and bridges, complete and partial dentures, implants, gum treatment, root canals, and extractions. Preventive care is also provided. Treatment is provided to both adults and children. Patients are selected via a lottery and there is often a waiting list.   [facility] [address] [place]  [phone]  [email or web address]   [facility] [address] [place], [place] [phone], Ext. [phone] Second and Fourth Thursday of each month, opens at 6:30 AM; Clinic ends at 9 AM.  Patients are seen on a first-come first-served basis, and a limited number are seen during each clinic.   [facility]  [address] Hillard Danker [place], [place] [phone]   Eligibility Requirements You must have lived in [place], [place], or [place] counties for at least the last three months.   You cannot be eligible for state or federal sponsored [employer], including [employer], [place], or [employer].   You generally cannot be eligible for healthcare insurance through your employer.    How to apply: Eligibility screenings are held every Tuesday and Wednesday afternoon from 1:00 pm until 4:00 pm. You do not need an appointment for the interview!  [facility] [address] [place], [place]   [place]  [place] Department  [place]  [phone]    Behavioral Health Resources in the Community: Intensive Outpatient Programs Organization         Address  Phone  Notes  [facility] [place]. [address], [place], [place] [phone]   [facility] [address], [place], [place]   ADS: [facility] [address] [place], [place]   [place] [address],  [place], [place] or [phone]   Substance Abuse Resources Organization         Address  Phone  Notes  Alcohol and Drug Services  [phone]   [place]  [phone]   [facility]   Chinita Pester  [phone]   Residential & Outpatient Substance Abuse Program  [phone]   Psychological Services Organization          Address  Phone  Notes  [facility] [place]  [place]  [phone]   [place] [address], [place] or [phone]    Mobile Crisis Teams Organization         Address  Phone  Notes  [facility], Mobile Crisis Care Unit  [phone]   [facility]  [address] [place], [place]   [facility] [address] [place]  [place] [phone]    Self-Help/Support Groups Organization         Address  Phone             Notes  [facility] [place] - variety of support groups  [phone]- [phone] Call for more information  Narcotics Anonymous (NA), [facility] [address], [employer] [place]  2 meetings at this location   Statistician         Address  Phone  Notes  [facility] [address],    [place]  [phone]   [facility]  [address] [place] [address], [place], [place] [phone]   [facility] [address] [place], [place] [phone] Admissions: 8am-3pm M-F  [facility] [address]    [place], [place] [phone]   [facility] [address] [place], [place], [place] [phone]   [facility] [address],  [place], [place] [phone]   [facility] - Intensive Outpatient [address], [place], [place] [phone]   [place] ([facility] [address]  [place], [place] [phone] or [phone]   [facility] ([facility]) [address], [place], [place] [phone] Accepts Medicaid  [facility] [place] [address],  [place] [phone] Substance Abuse/Addiction Treatment   [facility] Organization         Address  Phone  Notes  [facility]  [phone]   Angie Fava, PhD [address] Ervin Knack [place], [place]   [phone] or [phone]   [facility]   [address] [place], [place] [phone]   [facility] [address] [place], [place] [phone] Insurance/Medicaid/sponsorship through [facility] and Families [address]                                    [place], [place] [phone] Therapy/tele-psych/case  [facility] [address], [place] [phone]    Dr. Lolly Mustache  [phone]   [facility] [place]  [employer] [facility] Dept. 1) [address], [place] 2) [address] [place] 3)  [address], [place] [phone]  [phone]   [facility] Child Abuse Hotline 229-365-5457 or [phone] (After Hours)

## 2018-03-19 ENCOUNTER — Other Ambulatory Visit: Payer: Self-pay

## 2018-03-20 ENCOUNTER — Emergency Department (HOSPITAL_COMMUNITY)
Admission: EM | Admit: 2018-03-20 | Discharge: 2018-03-21 | Disposition: A | Discharge status: Home / Self Care | Payer: Self-pay | Attending: Emergency Medicine | Admitting: Emergency Medicine

## 2018-03-20 ENCOUNTER — Other Ambulatory Visit: Payer: Self-pay

## 2018-03-20 ENCOUNTER — Encounter (HOSPITAL_COMMUNITY): Payer: Self-pay | Admitting: Emergency Medicine

## 2018-03-20 ENCOUNTER — Emergency Department (HOSPITAL_COMMUNITY): Payer: Self-pay

## 2018-03-20 DIAGNOSIS — Z79899 Other long term (current) drug therapy: Secondary | ICD-10-CM | POA: Insufficient documentation

## 2018-03-20 DIAGNOSIS — R519 Headache, unspecified: Secondary | ICD-10-CM

## 2018-03-20 DIAGNOSIS — R51 Headache: Secondary | ICD-10-CM | POA: Insufficient documentation

## 2018-03-20 LAB — I-STAT CHEM 8, ED
BUN: 18 mg/dL (ref 6–20)
CREATININE: 0.9 mg/dL (ref 0.44–1.00)
Calcium, Ion: 1.06 mmol/L — ABNORMAL LOW (ref 1.15–1.40)
Chloride: 106 mmol/L (ref 98–111)
Glucose, Bld: 80 mg/dL (ref 70–99)
HEMATOCRIT: 29 % — AB (ref 36.0–46.0)
HEMOGLOBIN: 9.9 g/dL — AB (ref 12.0–15.0)
POTASSIUM: 3.6 mmol/L (ref 3.5–5.1)
SODIUM: 139 mmol/L (ref 135–145)
TCO2: 25 mmol/L (ref 22–32)

## 2018-03-20 LAB — COMPREHENSIVE METABOLIC PANEL
ALBUMIN: 3.6 g/dL (ref 3.5–5.0)
ALT: 11 U/L (ref 0–44)
ANION GAP: 10 (ref 5–15)
AST: 23 U/L (ref 15–41)
Alkaline Phosphatase: 88 U/L (ref 38–126)
BUN: 17 mg/dL (ref 6–20)
CHLORIDE: 105 mmol/L (ref 98–111)
CO2: 24 mmol/L (ref 22–32)
Calcium: 9.3 mg/dL (ref 8.9–10.3)
Creatinine, Ser: 0.92 mg/dL (ref 0.44–1.00)
GFR calc Af Amer: >60 mL/min (ref >60)
GFR calc non Af Amer: >60 mL/min (ref >60)
GLUCOSE: 86 mg/dL (ref 70–99)
Potassium: 3.9 mmol/L (ref 3.5–5.1)
SODIUM: 139 mmol/L (ref 135–145)
TOTAL PROTEIN: 7.5 g/dL (ref 6.5–8.1)
Total Bilirubin: 0.3 mg/dL (ref 0.3–1.2)

## 2018-03-20 LAB — PROTIME-INR
INR: 1.01
PROTHROMBIN TIME: 13.2 s (ref 11.4–15.2)

## 2018-03-20 LAB — I-STAT BETA HCG BLOOD, ED (MC, WL, AP ONLY): I-stat hCG, quantitative: <5 m[IU]/mL (ref <5)

## 2018-03-20 LAB — I-STAT TROPONIN, ED: Troponin i, poc: 0 ng/mL (ref 0.00–0.08)

## 2018-03-20 LAB — APTT: aPTT: 31 seconds (ref 24–36)

## 2018-03-20 NOTE — ED Triage Notes (Signed)
Pt reports facial swelling on the L side, L sided numbness and headaches. Onset at 1300 today. Reports headaches for a couple days.

## 2018-03-21 LAB — CBC
HCT: 30.4 % — ABNORMAL LOW (ref 36.0–46.0)
Hemoglobin: 8 g/dL — ABNORMAL LOW (ref 12.0–15.0)
MCH: 19.6 pg — ABNORMAL LOW (ref 26.0–34.0)
MCHC: 26.3 g/dL — AB (ref 30.0–36.0)
MCV: 74.5 fL — AB (ref 78.0–100.0)
PLATELETS: 466 10*3/uL — AB (ref 150–400)
RBC: 4.08 MIL/uL (ref 3.87–5.11)
RDW: 18.8 % — AB (ref 11.5–15.5)
WBC: 8.3 10*3/uL (ref 4.0–10.5)

## 2018-03-21 LAB — DIFFERENTIAL
Basophils Absolute: 0.1 10*3/uL (ref 0.0–0.1)
Basophils Relative: 1 %
EOS ABS: 0.1 10*3/uL (ref 0.0–0.7)
EOS PCT: 1 %
LYMPHS ABS: 2.7 10*3/uL (ref 0.7–4.0)
Lymphocytes Relative: 33 %
Monocytes Absolute: 0.6 10*3/uL (ref 0.1–1.0)
Monocytes Relative: 7 %
Neutro Abs: 4.8 10*3/uL (ref 1.7–7.7)
Neutrophils Relative %: 58 %

## 2018-03-21 MED ORDER — DIPHENHYDRAMINE HCL 25 MG PO CAPS
25.0000 mg | ORAL_CAPSULE | Freq: Once | ORAL | Status: AC
  Administered 2018-03-21: 25 mg via ORAL
  Filled 2018-03-21: qty 1

## 2018-03-21 MED ORDER — DEXAMETHASONE 4 MG PO TABS
10.0000 mg | ORAL_TABLET | Freq: Once | ORAL | Status: AC
  Administered 2018-03-21: 10 mg via ORAL
  Filled 2018-03-21: qty 3

## 2018-03-21 MED ORDER — ACETAMINOPHEN 500 MG PO TABS
1000.0000 mg | ORAL_TABLET | Freq: Once | ORAL | Status: AC
  Administered 2018-03-21: 1000 mg via ORAL
  Filled 2018-03-21: qty 2

## 2018-03-21 MED ORDER — METOCLOPRAMIDE HCL 10 MG PO TABS
10.0000 mg | ORAL_TABLET | Freq: Once | ORAL | Status: AC
  Administered 2018-03-21: 10 mg via ORAL
  Filled 2018-03-21: qty 1

## 2018-03-21 NOTE — ED Provider Notes (Signed)
[facility] EMERGENCY DEPARTMENT Provider Note  CSN: [id number] Arrival date & time: 03/20/18 2156  Chief Complaint(s) Headache and Facial Pain  HPI Terri Stanton is a 42 y.o. female with a history of migraine headaches who presents to the emergency department with new type of headache that began yesterday morning.  She reports that she woke up with a slight headache which gradually worsened in the early afternoon.  It is located to left temporal/face.  Patient states that she feels like her left cheek is swollen.  No alleviating or aggravating factors.  Patient tried taking Goody powder that did not provide any relief.  She denies any trauma.  She denies any fevers or chills.  No visual deficits.  No focal deficits.  No nausea or vomiting.  No chest pain or shortness of breath.  No abdominal pain.  HPI  Past Medical History Past Medical History:  Diagnosis Date  . Obesity    There are no active problems to display for this patient.  Home Medication(s) Prior to Admission medications   Medication Sig Start Date End Date Taking? Authorizing Provider  docusate sodium (COLACE) 100 MG capsule Take 1 capsule (100 mg total) by mouth every 12 (twelve) hours. 05/30/14   Mellody Drown, PA-C  ferrous sulfate 325 (65 FE) MG tablet Take 1 tablet (325 mg total) by mouth 3 (three) times daily with meals. 05/30/14   Mellody Drown, PA-C  ibuprofen (ADVIL,MOTRIN) 800 MG tablet Take 1 tablet (800 mg total) by mouth 3 (three) times daily with meals. 05/29/14   Mellody Drown, PA-C  traMADol (ULTRAM) 50 MG tablet Take 1 tablet (50 mg total) by mouth every 6 (six) hours as needed. 05/29/14   Mellody Drown, PA-C                                                                                                                                    Past Surgical History History reviewed. No pertinent surgical history. Family History History reviewed. No pertinent family history.  Social  History Social History   Tobacco Use  . Smoking status: Never Smoker  . Smokeless tobacco: Never Used  Substance Use Topics  . Alcohol use: No  . Drug use: No   Allergies Patient has no known allergies.  Review of Systems Review of Systems All other systems are reviewed and are negative for acute change except as noted in the HPI  Physical Exam Vital Signs  I have reviewed the triage vital signs BP 139/87   Pulse 64   Temp 98.5 F (36.9 C) (Oral)   Resp 20   Ht 5\' 3"  (1.6 m)   Wt 97.5 kg (215 lb)   LMP 02/27/2018 (Within Days)   SpO2 99%   BMI 38.09 kg/m   Physical Exam  Constitutional: She is oriented to person, place, and time. She appears well-developed and well-nourished. No distress.  HENT:  Head: Normocephalic  and atraumatic.  Nose: Nose normal.  No palpable cord or tenderness to palpation of the temporal region  Eyes: Pupils are equal, round, and reactive to light. Conjunctivae and EOM are normal. Right eye exhibits no discharge. Left eye exhibits no discharge. No scleral icterus.  Fundoscopic exam:      The right eye shows no papilledema.       The left eye shows no papilledema.  Neck: Normal range of motion. Neck supple.  Cardiovascular: Normal rate and regular rhythm. Exam reveals no gallop and no friction rub.  No murmur heard. Pulmonary/Chest: Effort normal and breath sounds normal. No stridor. No respiratory distress. She has no rales.  Abdominal: Soft. She exhibits no distension. There is no tenderness.  Musculoskeletal: She exhibits no edema or tenderness.  Neurological: She is alert and oriented to person, place, and time.  Mental Status:  Alert and oriented to person, place, and time.  Attention and concentration normal.  Speech clear.  Recent memory is intact  Cranial Nerves:  II Visual Fields: Intact to confrontation. Visual fields intact. III, IV, VI: Pupils equal and reactive to light and near. Full eye movement without nystagmus  V  Facial Sensation: Normal. No weakness of masticatory muscles  VII: No facial weakness or asymmetry  VIII Auditory Acuity: Grossly normal  IX/X: The uvula is midline; the palate elevates symmetrically  XI: Normal sternocleidomastoid and trapezius strength  XII: The tongue is midline. No atrophy or fasciculations.   Motor System: Muscle Strength: 5/5 and symmetric in the upper and lower extremities. No pronation or drift.  Muscle Tone: Tone and muscle bulk are normal in the upper and lower extremities.   Reflexes: DTRs: 1+ and symmetrical in all four extremities. No Clonus Coordination: Intact finger-to-nose, heel-to-shin. No tremor.  Sensation: Intact to light touch, and pinprick. Negative Romberg test.  Gait: Routine  gait normal.   Skin: Skin is warm and dry. No rash noted. She is not diaphoretic. No erythema.  Psychiatric: She has a normal mood and affect.  Vitals reviewed.   ED Results and Treatments Labs (all labs ordered are listed, but only abnormal results are displayed) Labs Reviewed  CBC - Abnormal; Notable for the following components:      Result Value   Hemoglobin 8.0 (*)    HCT 30.4 (*)    MCV 74.5 (*)    MCH 19.6 (*)    MCHC 26.3 (*)    RDW 18.8 (*)    Platelets 466 (*)    All other components within normal limits  I-STAT CHEM 8, ED - Abnormal; Notable for the following components:   Calcium, Ion 1.06 (*)    Hemoglobin 9.9 (*)    HCT 29.0 (*)    All other components within normal limits  PROTIME-INR  APTT  DIFFERENTIAL  COMPREHENSIVE METABOLIC PANEL  I-STAT TROPONIN, ED  I-STAT BETA HCG BLOOD, ED ([facility], [facility], [facility] ONLY)  CBG MONITORING, ED  EKG  EKG Interpretation  Date/Time:    Ventricular Rate:    PR Interval:    QRS Duration:   QT Interval:    QTC Calculation:   R Axis:     Text Interpretation:        Radiology Ct Head Wo  Contrast  Result Date: 03/20/2018 CLINICAL DATA:  42 year old female with TIA. Left-sided facial numbness. EXAM: CT HEAD WITHOUT CONTRAST TECHNIQUE: Contiguous axial images were obtained from the base of the skull through the vertex without intravenous contrast. COMPARISON:  Brain MRI dated 06/17/2010 FINDINGS: Brain: The ventricles and sulci appropriate size for patient's age. Minimal periventricular and deep white matter chronic microvascular ischemic changes noted. There is no acute intracranial hemorrhage. No mass effect or midline shift. No extra-axial fluid collection. Vascular: No hyperdense vessel or unexpected calcification. Skull: Normal. Negative for fracture or focal lesion. Sinuses/Orbits: No acute finding. Probable old fracture of the left lamina Propecia. Other: None IMPRESSION: No acute intracranial pathology. Electronically Signed   By: Elgie CollardArash  Radparvar M.D.   On: 03/20/2018 23:38   Pertinent labs & imaging results that were available during my care of the patient were reviewed by me and considered in my medical decision making (see chart for details).  Medications Ordered in ED Medications  metoCLOPramide (REGLAN) tablet 10 mg (10 mg Oral Given 03/21/18 0429)  diphenhydrAMINE (BENADRYL) capsule 25 mg (25 mg Oral Given 03/21/18 0430)  dexamethasone (DECADRON) tablet 10 mg (10 mg Oral Given 03/21/18 0429)  acetaminophen (TYLENOL) tablet 1,000 mg (1,000 mg Oral Given 03/21/18 0430)                                                                                                                                    Procedures Procedures  (including critical care time)  Medical Decision Making / ED Course I have reviewed the nursing notes for this encounter and the patient's prior records (if available in EHR or on provided paperwork).    Non focal neuro exam. No recent head trauma. No fever. Doubt meningitis. Doubt intracranial bleed. Doubt IIH.  Doubt temporal arteritis.   No obvious  swelling on exam.  No evidence of cellulitis or abscess.  Not consistent with trigeminal neuralgia.  Patient seen in first look process and screening labs obtained which were grossly reassuring.  CT scan negative.    Will treat with migraine cocktail.  The patient appears reasonably screened and/or stabilized for discharge and I doubt any other medical condition or other [facility] requiring further screening, evaluation, or treatment in the ED at this time prior to discharge.  The patient is safe for discharge with strict return precautions.   Final Clinical Impression(s) / ED Diagnoses Final diagnoses:  Bad headache   Disposition: Discharge  Condition: Good  I have discussed the results, Dx and Tx plan with the patient who expressed understanding and agree(s) with the plan. Discharge instructions discussed at great length. The  patient was given strict return precautions who verbalized understanding of the instructions. No further questions at time of discharge.    ED Discharge Orders    None       Follow Up: Primary care provider   If you do not have a primary care physician, contact [facility] at [phone] for referral      This chart was dictated using voice recognition software.  Despite best efforts to proofread,  errors can occur which can change the documentation meaning.   Nira Conn, MD 03/21/18 [phone]

## 2018-10-27 IMAGING — CT CT HEAD W/O CM
4 series · 16 of 47 positions shown, 18 images · non-contrast
Comparison: Brain MRI dated 06/17/2010

CLINICAL DATA: 42-year-old female with TIA. Left-sided facial
numbness.

EXAM:
CT HEAD WITHOUT CONTRAST
TECHNIQUE: Contiguous axial images were obtained from the base of the skull
through the vertex without intravenous contrast.

[Series 3: head without · axial · non-contrast · 0.41mm/px · z∈[-99,+11]mm · 7 of 30 slices shown, 9 images]
[im 4/30  brain]
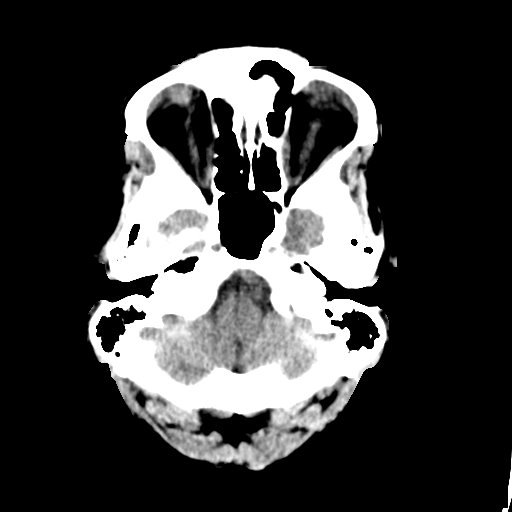
[im 4/30  bone]
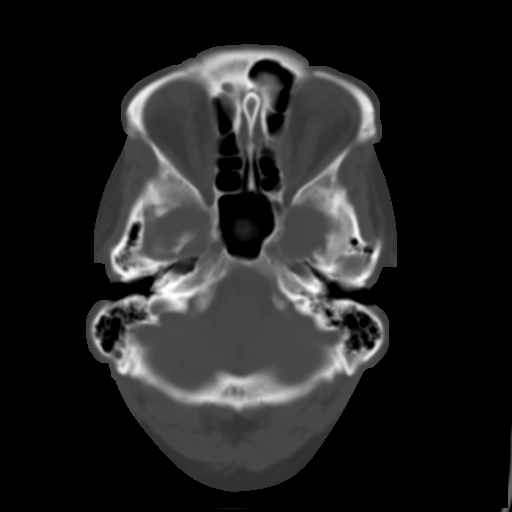
[im 8/30  brain]
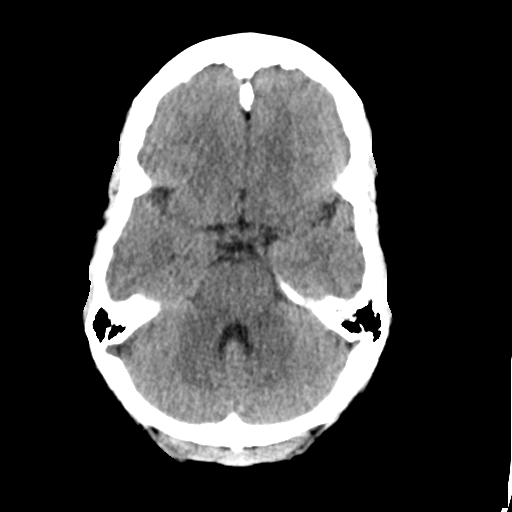
[im 11/30  brain]
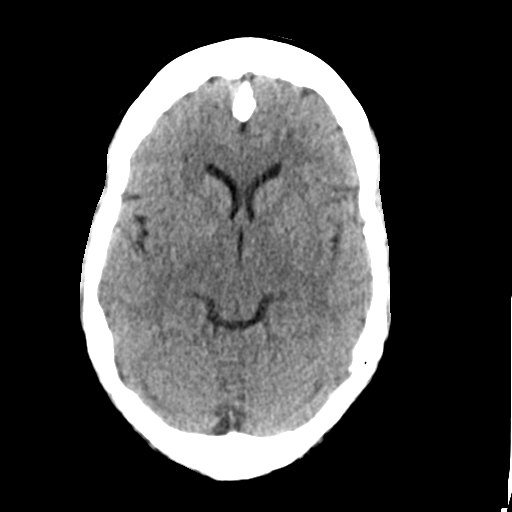
[im 15/30  brain]
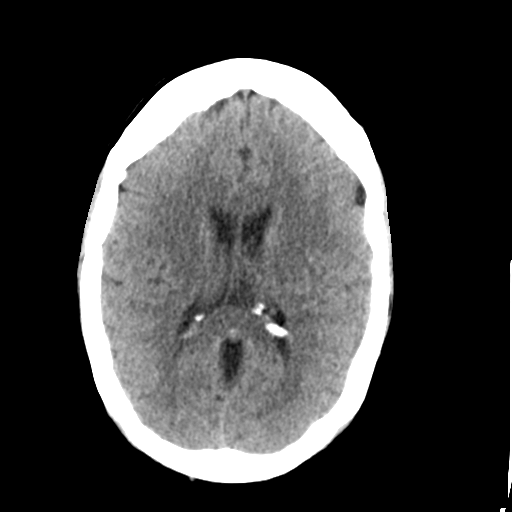
[im 19/30  brain]
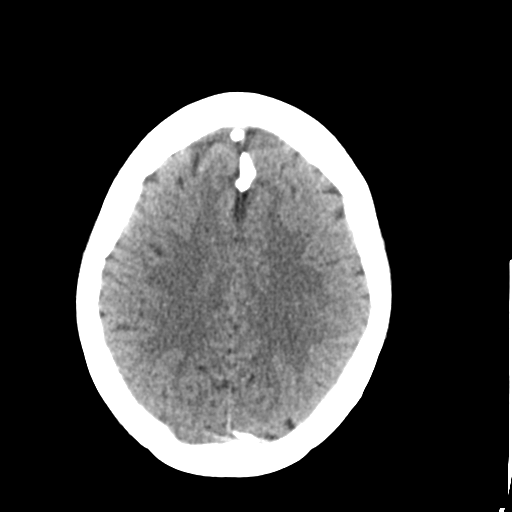
[im 19/30  bone]
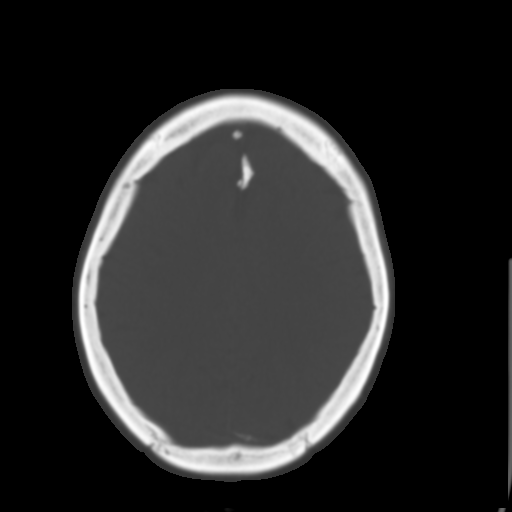
[im 22/30  brain]
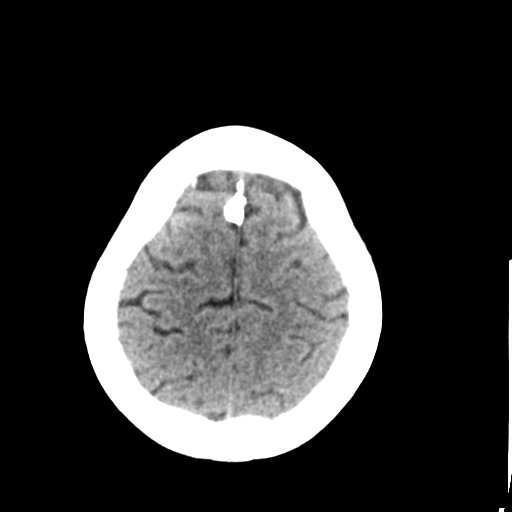
[im 26/30  brain]
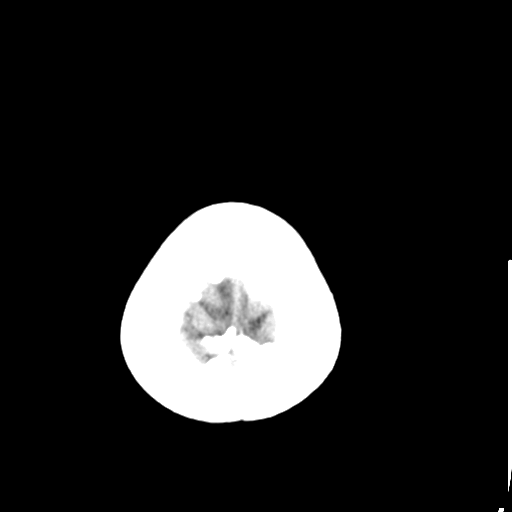

[Series 4: head bone · axial · 0.41mm/px · z∈[-100,-70]mm · 3 of 75 slices shown]
[im 8/75  bone]
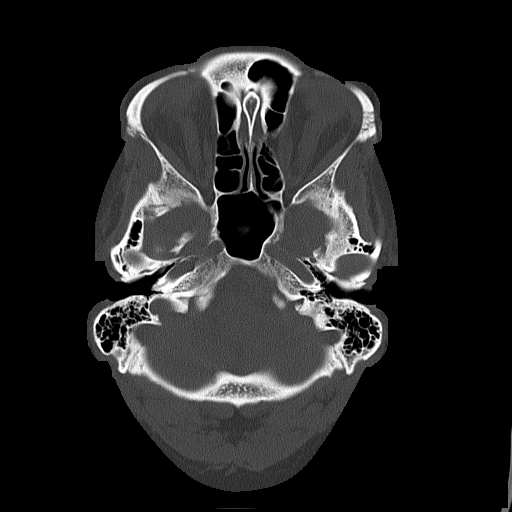
[im 15/75  bone]
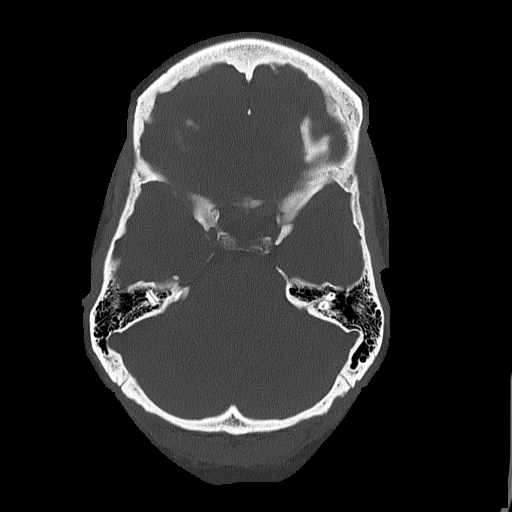
[im 23/75  bone]
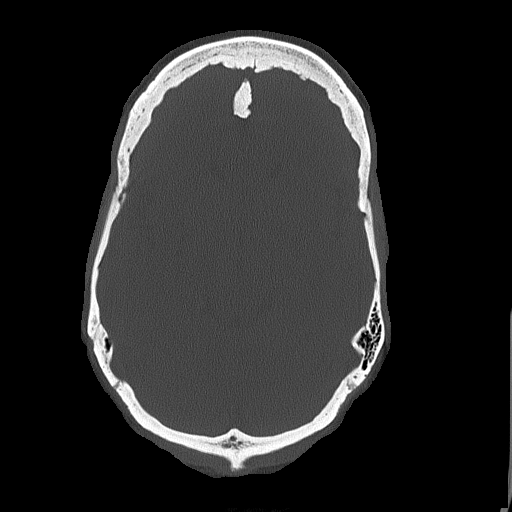

[Series 5: head without cor · coronal · non-contrast · 0.29mm/px · 3 of 60 slices shown]
[im 20/60  brain]
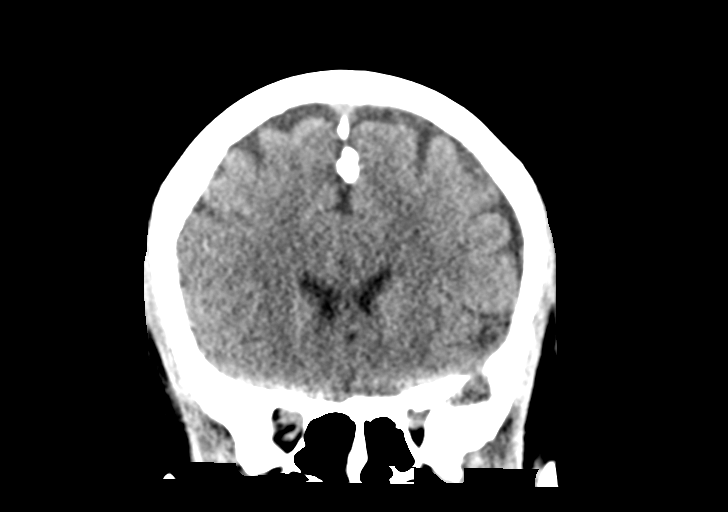
[im 27/60  brain]
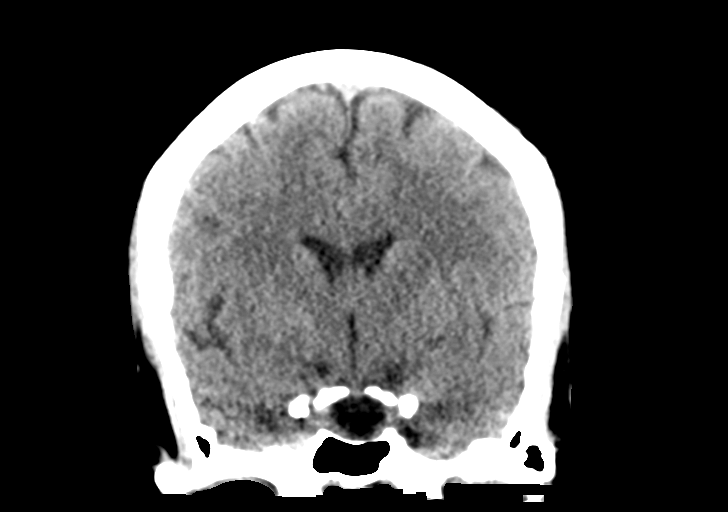
[im 33/60  brain]
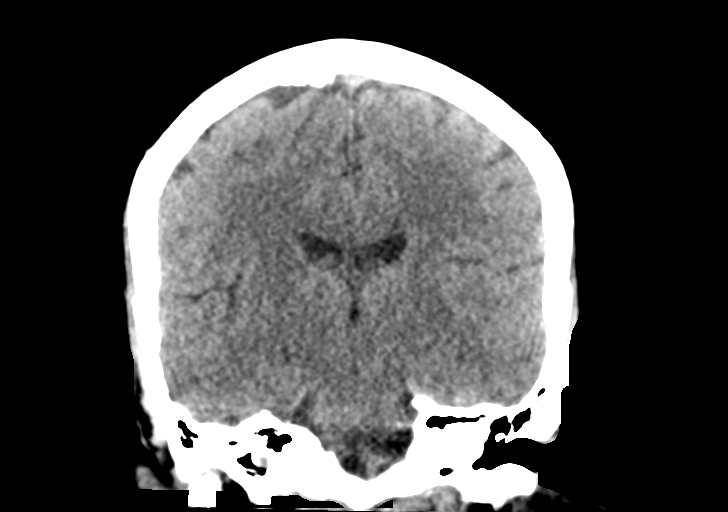

[Series 6: head without sag · sagittal · non-contrast · 0.33mm/px · 3 of 46 slices shown]
[im 16/46  brain]
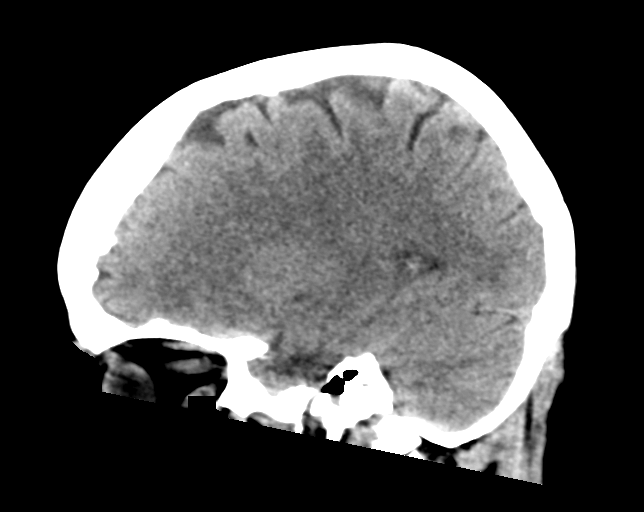
[im 23/46  brain]
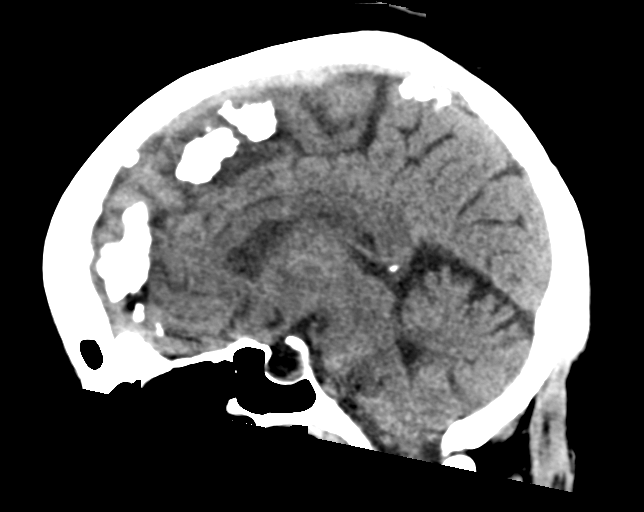
[im 31/46  brain]
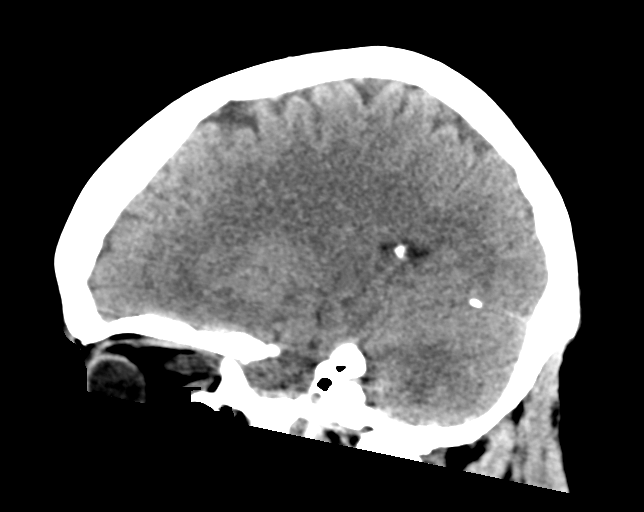

[16 of 47 positions shown; findings below may reference images not displayed]

FINDINGS: Brain: The ventricles and sulci appropriate size for patient's age.
Minimal periventricular and deep white matter chronic microvascular
ischemic changes noted. There is no acute intracranial hemorrhage.
No mass effect or midline shift. No extra-axial fluid collection.

Vascular: No hyperdense vessel or unexpected calcification.

Skull: Normal. Negative for fracture or focal lesion.

Sinuses/Orbits: No acute finding. Probable old fracture of the left
lamina Propecia.

Other: None
IMPRESSION: No acute intracranial pathology.
